# Patient Record
Sex: Male | Born: 2000 | Race: Black or African American | Hispanic: No | Marital: Single | Smoking: Never smoker
Health system: Southern US, Community
[De-identification: ages and names within clinical notes are randomized; demographics above are authoritative.]

## PROBLEM LIST (undated history)

## (undated) DIAGNOSIS — N390 Urinary tract infection, site not specified: Secondary | ICD-10-CM

## (undated) DIAGNOSIS — H6690 Otitis media, unspecified, unspecified ear: Secondary | ICD-10-CM

## (undated) DIAGNOSIS — T7840XA Allergy, unspecified, initial encounter: Secondary | ICD-10-CM

## (undated) HISTORY — DX: Allergy, unspecified, initial encounter: T78.40XA

## (undated) HISTORY — PX: MYRINGOTOMY: SUR874

---
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
## 2021-08-21 ENCOUNTER — Emergency Department (HOSPITAL_COMMUNITY)
Admission: EM | Admit: 2021-08-21 | Discharge: 2021-08-21 | Discharge status: Against Medical Advice | Payer: PRIVATE HEALTH INSURANCE

## 2021-08-21 NOTE — ED Notes (Signed)
Called x 3 NO answer
